# Patient Record
Sex: Male | Born: 1935 | Race: White | Hispanic: No | Marital: Married | Smoking: Former smoker
Health system: Southern US, Community
[De-identification: ages and names within clinical notes are randomized; demographics above are authoritative.]

## PROBLEM LIST (undated history)

## (undated) DIAGNOSIS — E785 Hyperlipidemia, unspecified: Secondary | ICD-10-CM

## (undated) DIAGNOSIS — G231 Progressive supranuclear ophthalmoplegia [Steele-Richardson-Olszewski]: Secondary | ICD-10-CM

## (undated) DIAGNOSIS — I219 Acute myocardial infarction, unspecified: Secondary | ICD-10-CM

## (undated) HISTORY — DX: Hyperlipidemia, unspecified: E78.5

## (undated) HISTORY — DX: Acute myocardial infarction, unspecified: I21.9

## (undated) HISTORY — DX: Progressive supranuclear ophthalmoplegia (steele-Richardson-olszewski): G23.1

---

## 1997-12-11 ENCOUNTER — Emergency Department (HOSPITAL_COMMUNITY): Admission: EM | Admit: 1997-12-11 | Discharge: 1997-12-11 | Payer: Self-pay | Admitting: Emergency Medicine

## 1998-05-28 HISTORY — PX: CARDIAC SURGERY: SHX584

## 1999-02-09 ENCOUNTER — Encounter: Payer: Self-pay | Admitting: *Deleted

## 1999-02-09 ENCOUNTER — Inpatient Hospital Stay (HOSPITAL_COMMUNITY): Admission: EM | Admit: 1999-02-09 | Discharge: 1999-02-14 | Payer: Self-pay | Admitting: Emergency Medicine

## 1999-02-11 ENCOUNTER — Encounter: Payer: Self-pay | Admitting: Emergency Medicine

## 2005-10-23 ENCOUNTER — Ambulatory Visit: Payer: Self-pay | Admitting: Internal Medicine

## 2005-10-31 ENCOUNTER — Ambulatory Visit: Payer: Self-pay | Admitting: Internal Medicine

## 2006-01-15 ENCOUNTER — Ambulatory Visit: Payer: Self-pay

## 2006-01-16 ENCOUNTER — Ambulatory Visit: Payer: Self-pay

## 2006-03-07 ENCOUNTER — Ambulatory Visit: Payer: Self-pay | Admitting: Internal Medicine

## 2006-03-07 LAB — CONVERTED CEMR LAB
Chol/HDL Ratio, serum: 5.6
Cholesterol: 243 mg/dL (ref 0–200)
Creatinine, Ser: 1.1 mg/dL (ref 0.4–1.5)
GFR calc non Af Amer: 70 mL/min
HDL: 43.2 mg/dL (ref >39.0)
LDL DIRECT: 173.2 mg/dL
PSA: 6.09 ng/mL — ABNORMAL HIGH (ref 0.10–4.00)
Total Bilirubin: 0.7 mg/dL (ref 0.3–1.2)
Triglyceride fasting, serum: 72 mg/dL (ref 0–149)
VLDL: 14 mg/dL (ref 0–40)

## 2006-03-13 ENCOUNTER — Ambulatory Visit: Payer: Self-pay | Admitting: Internal Medicine

## 2006-05-24 ENCOUNTER — Ambulatory Visit: Payer: Self-pay | Admitting: Internal Medicine

## 2006-05-24 LAB — CONVERTED CEMR LAB
Chol/HDL Ratio, serum: 5.7
Triglyceride fasting, serum: 108 mg/dL (ref 0–149)

## 2006-06-25 ENCOUNTER — Ambulatory Visit: Payer: Self-pay | Admitting: Internal Medicine

## 2006-08-23 ENCOUNTER — Encounter: Admission: RE | Admit: 2006-08-23 | Discharge: 2006-08-23 | Payer: Self-pay | Admitting: Neurology

## 2006-09-10 ENCOUNTER — Ambulatory Visit: Payer: Self-pay | Admitting: Internal Medicine

## 2006-12-13 ENCOUNTER — Encounter: Admission: RE | Admit: 2006-12-13 | Discharge: 2007-03-13 | Payer: Self-pay | Admitting: Neurology

## 2007-05-29 HISTORY — PX: ABDOMINAL AORTIC ANEURYSM REPAIR: SHX42

## 2007-07-01 ENCOUNTER — Emergency Department (HOSPITAL_COMMUNITY): Admission: EM | Admit: 2007-07-01 | Discharge: 2007-07-01 | Payer: Self-pay | Admitting: Emergency Medicine

## 2007-07-08 ENCOUNTER — Ambulatory Visit (HOSPITAL_COMMUNITY): Admission: RE | Admit: 2007-07-08 | Discharge: 2007-07-09 | Payer: Self-pay | Admitting: Orthopedic Surgery

## 2007-12-27 DIAGNOSIS — G231 Progressive supranuclear ophthalmoplegia [Steele-Richardson-Olszewski]: Secondary | ICD-10-CM

## 2007-12-27 HISTORY — DX: Progressive supranuclear ophthalmoplegia (steele-Richardson-olszewski): G23.1

## 2007-12-29 ENCOUNTER — Ambulatory Visit: Payer: Self-pay | Admitting: Vascular Surgery

## 2007-12-31 ENCOUNTER — Ambulatory Visit (HOSPITAL_COMMUNITY): Admission: RE | Admit: 2007-12-31 | Discharge: 2007-12-31 | Payer: Self-pay | Admitting: Vascular Surgery

## 2007-12-31 ENCOUNTER — Ambulatory Visit: Payer: Self-pay | Admitting: *Deleted

## 2008-01-21 ENCOUNTER — Inpatient Hospital Stay (HOSPITAL_COMMUNITY): Admission: RE | Admit: 2008-01-21 | Discharge: 2008-01-24 | Payer: Self-pay | Admitting: Vascular Surgery

## 2008-01-21 HISTORY — PX: ABDOMINAL AORTIC ANEURYSM REPAIR: SUR1152

## 2008-02-24 ENCOUNTER — Encounter: Admission: RE | Admit: 2008-02-24 | Discharge: 2008-02-24 | Payer: Self-pay | Admitting: Vascular Surgery

## 2008-02-24 ENCOUNTER — Ambulatory Visit: Payer: Self-pay | Admitting: Vascular Surgery

## 2008-08-13 ENCOUNTER — Encounter: Admission: RE | Admit: 2008-08-13 | Discharge: 2008-08-13 | Payer: Self-pay | Admitting: Internal Medicine

## 2008-09-07 ENCOUNTER — Ambulatory Visit: Payer: Self-pay | Admitting: Vascular Surgery

## 2008-09-07 ENCOUNTER — Encounter: Admission: RE | Admit: 2008-09-07 | Discharge: 2008-09-07 | Payer: Self-pay | Admitting: Vascular Surgery

## 2008-12-02 ENCOUNTER — Encounter (INDEPENDENT_AMBULATORY_CARE_PROVIDER_SITE_OTHER): Payer: Self-pay | Admitting: *Deleted

## 2009-03-08 ENCOUNTER — Encounter: Admission: RE | Admit: 2009-03-08 | Discharge: 2009-03-08 | Payer: Self-pay | Admitting: Vascular Surgery

## 2009-03-08 ENCOUNTER — Ambulatory Visit: Payer: Self-pay | Admitting: Vascular Surgery

## 2009-06-09 ENCOUNTER — Telehealth: Payer: Self-pay | Admitting: Internal Medicine

## 2009-09-06 ENCOUNTER — Encounter: Admission: RE | Admit: 2009-09-06 | Discharge: 2009-09-06 | Payer: Self-pay | Admitting: Vascular Surgery

## 2009-09-06 ENCOUNTER — Ambulatory Visit: Payer: Self-pay | Admitting: Vascular Surgery

## 2010-05-08 ENCOUNTER — Ambulatory Visit: Payer: Self-pay | Admitting: Vascular Surgery

## 2010-06-18 ENCOUNTER — Encounter: Payer: Self-pay | Admitting: Internal Medicine

## 2010-06-19 ENCOUNTER — Encounter: Payer: Self-pay | Admitting: Vascular Surgery

## 2010-06-27 NOTE — Progress Notes (Signed)
Summary: Schedule Colonoscopy  Phone Note Outgoing Call Call back at [facility] Phone [phone]   Call placed by: Harlow Mares CMA Duncan Dull),  June 09, 2009 4:11 PM Call placed to: Patient Summary of Call: Left message on patients machine to call back. patient needs to have a direct colonoscopy if he meets the guidlines.  Initial call taken by: Harlow Mares CMA Duncan Dull),  June 09, 2009 4:12 PM  Follow-up for Phone Call        Left message on patients machine to call back.  Follow-up by: Harlow Mares CMA Duncan Dull),  June 17, 2009 11:54 AM  Additional Follow-up for Phone Call Additional follow up Details #1::        Left message on patients machine to call back. multiple contacts to contact the patient without a return call  Additional Follow-up by: Harlow Mares CMA Duncan Dull),  June 23, 2009 10:41 AM     Appended Document: Schedule Colonoscopy pt says he has GI care elsewhere

## 2010-10-03 ENCOUNTER — Other Ambulatory Visit: Payer: Self-pay | Admitting: Vascular Surgery

## 2010-10-03 DIAGNOSIS — I714 Abdominal aortic aneurysm, without rupture: Secondary | ICD-10-CM

## 2010-10-10 NOTE — Assessment & Plan Note (Signed)
OFFICE VISIT   Rodney Velazquez, Rodney Velazquez  DOB:  23-Nov-1931                                       09/07/2008  UXLKG#:[id number]   The patient is status post insertion of aortic stent graft of abdominal  aortic aneurysm August 2009 using a Gore excluder graft.  He did have a  type 1 leak in the OR which required a proximal extension which resolved  the leak.  On his first CT scan in September there was a very subtle  possible type 2 leak.  Today he returns and review of the CT scan  reveals again a subtle probable type 2 leak from the lumbar branch in  the midportion of the aortic aneurysm sac.  The aneurysm sac itself has  slightly decreased in size to about 5.1 cm in maximum diameter.  He has  not had any abdominal or back symptoms.   On exam today blood pressure 158/82, heart rate 70, respirations 14.  His carotid pulse is 3+, no audible bruits.  Neurologic exam reveals  some somewhat slow speech possibly from his Parkinson's disease which is  being treated at [facility].  Abdomen is soft, nontender with no pulsatile  mass noted.  He has 3+ femoral and posterior tibial pulses bilaterally.   I think he does have a very small type 2 endoleak from the lumbar branch  we will continue to follow.  He will return in 6 month with a followup  CT angiogram at that time unless he develops any acute abdominal  symptoms in the interim.   Quita Skye Hart Rochester, M.D.  Electronically Signed   JDL/MEDQ  D:  09/07/2008  T:  09/08/2008  Job:  [id number]

## 2010-10-10 NOTE — Procedures (Signed)
DUPLEX ULTRASOUND OF ABDOMINAL AORTA   INDICATION:  Followup abdominal aortic aneurysm stent with known type 2  endo leak.   HISTORY:  Diabetes:  No.  Cardiac:  Previous MI.  Hypertension:  Yes.  Smoking:  Previous.  Connective Tissue Disorder:  Family History:  No.  Previous Surgery:  Stent graft repair of AAA on 01/21/2008.   DUPLEX EXAM:         AP (cm)                   TRANSVERSE (cm)  Proximal             2.6 cm                    2.6 cm  Mid                  2.8 cm                    2.9 cm  Distal               5.0 cm                    4.7 cm  Right Iliac          Not visualized            Not visualized  Left Iliac           Not visualized            Not visualized   PREVIOUS:  Date:  09/06/2009 (CT)  AP:  5.0  TRANSVERSE:   IMPRESSION:  1. Patent stent repair of abdominal aortic aneurysm with no evidence      of stenosis or endo leak based on limited visualization, however,      the previous CAT scans have noted a small type 2 endo leak.  2. The abdominal aortic aneurysm sac size is stable when compared to      the previous CT.  3. Unable to adequately visualize the bilateral common iliac arteries      due to overlying bowel gas patterns.   ___________________________________________  Quita Skye Hart Rochester, M.D.   CH/MEDQ  D:  05/08/2010  T:  05/08/2010  Job:  [id number]

## 2010-10-10 NOTE — Assessment & Plan Note (Signed)
OFFICE VISIT   KHUP, SAPIA  DOB:  1932-01-17                                       03/08/2009  WUJWJ#:[id number]   The patient returns for 6 month followup regarding his aortic stent  graft insertion for an infrarenal abdominal aortic aneurysm.  He was  last seen 6 months ago.  The aneurysm surgical procedure was performed  in August of 2009.  He has had a small type 2 endo leak which we have  been following from either a lumbar or accessory renal vessel which has  been stable.  He complains of no abdominal symptoms since his last visit  and a CT angiogram today revealed no change in the type 2 endo leak  which still appears to be present.  The size of the aneurysm has not  enlarged, however, remaining around 5 cm in diameter.  Both iliac limbs  are widely patent with no migration of the graft.  He has had a few  falling episodes and has discussed this with Dr. Clelia Croft who has adjusted  his medications.  He seems to lose his balance and fall backwards on  occasion.   PHYSICAL EXAM:  Blood pressure 151/82, heart rate 74, respirations 14.  His carotid pulses are 3+ with no audible bruits.  Neurologic exam is  grossly normal.  Abdomen is soft, nontender with no pulsatile mass  noted.  He has 3+ femoral and popliteal pulses bilaterally and 2+  posterior tibial pulses and well-perfused lower extremities.   In general I think he is doing well from the standpoint of his aneurysm  surgery.  Apparently he is being evaluated by a neurologist in [place]  currently.  He will return in 6 months with repeat CT angiogram to  continue to monitor this type 2 endo leak.   Quita Skye Hart Rochester, M.D.  Electronically Signed   JDL/MEDQ  D:  03/08/2009  T:  03/09/2009  Job:  [id number]

## 2010-10-10 NOTE — Consult Note (Signed)
VASCULAR SURGERY CONSULTATION   CHANCELLOR, VANDERLOOP  DOB:  06/28/1931                                       12/29/2007  JWJXB#:[id number]   This is a Vascular Surgery consultation.  The patient was referred by  Dr. Clelia Croft for evaluation of a recently diagnosed abdominal aortic  aneurysm.  This 77 year old gentleman has apparently lost about 20-25  pounds in the last 8-12 months, currently weighing about 183 pounds, he  states.  He has had a good appetite and recently his weight loss has  stopped and he is now doing better from that standpoint.  He had a chest  and abdomen CT ordered by Dr. Clelia Croft to look for an occult malignancy and  there was some findings which included mild emphysema as well as some  scattered mediastinal lymph nodes, cholelithiasis, left renal cyst as  well as some diverticulosis in the colon.  Also noted was a larger than  5 cm infrarenal abdominal aortic aneurysm and he was referred for  further evaluation.  He has no history previously knowing about this  aneurysm.   PAST MEDICAL HISTORY:  1. Hypertension.  2. Hyperlipidemia.  3. Coronary artery disease with previous myocardial infarction      apparently in 2000 when he suffered a bee sting, gave himself a      shot of epinephrine which apparently may have had something to do      with causing the myocardial infarction according to the patient.      He had PTCA and stenting done by Dr. Daphene Jaeger, he has done well      since that time.  4. Negative for known diabetes, congestive heart failure.   SURGERY:  The PTCA and stenting.   FAMILY HISTORY:  Negative coronary artery disease, diabetes and stroke.   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  He has not smoked in 30 years, but smoked 2 packs a day prior  to that.  Does not use alcohol.   REVIEW OF SYSTEMS:  Is significant in that he has been having some  balance problems and has had evaluation by a  neurologist at [facility]  with possible diagnosis of Parkinson's disease, although this is not  definite.  He has not seen this neurologist in about 1 year.  His wife  thinks that he continues to have some symptoms although they have  stabilized somewhat.  Denies any chest pain, dyspnea on exertion, PND,  orthopnea, has no known pulmonary or GI symptoms.   ALLERGIES:  None known.   MEDICATIONS:  Sinemet 3 tablets three times a day, aspirin 1 tablet  daily, blood pressure medication, unknown what this is.   PHYSICAL EXAM:  Vitals:  Blood pressure 128/80, heart rate 70,  respirations 14.  General:  He is a male patient, he is in no apparent  distress, alert and x3.  Neck:  Supple, 3+ carotid pulses palpable.  No  bruits are audible.  Neurologic:  Normal.  There is no palpable  adenopathy in the neck.  Chest:  Clear to auscultation.  Cardiovascular:  Regular rhythm with no murmurs.  Abdomen:  Soft, nontender with  pulsatile mass in the mid epigastrium measuring about 5 cm in diameter.  This is not tender.  He has 3+ femoral, popliteal,  and dorsalis pedis  pulses palpable bilaterally.  Both upper extremities are well perfused.   I reviewed his CT scan today, done at [facility], which was done with  contrast and this reveals an infrarenal bilobulated aneurysm measuring  at least 5 cm in diameter, extends down to the aortic bifurcation.  His  iliacs are quite tortuous, particularly the one on the left which  extends well over to the right of the midline.   We will need to evaluate him to see if he is a stent graft candidate,  but I am concerned about the tortuosity of the iliac arteries.  Will  obtain a Cardiolite, by Dr. Landry Dyke office, for by his previous history  of PTCA and stenting, and obtain abdominal aortogram this week and he  will return next week further discussion of this.   Quita Skye Hart Rochester, M.D.  Electronically Signed  JDL/MEDQ  D:  12/29/2007  T:  12/30/2007  Job:   [id number]   cc:   Kari Baars, M.D.

## 2010-10-10 NOTE — Op Note (Signed)
NAMEJOANGEL, VANOSDOL             ACCOUNT NO.:  [id number]   MEDICAL RECORD NO.:  [id number]          PATIENT TYPE:  AMB   LOCATION:  SDS                          FACILITY:  [facility]   PHYSICIAN:  Balinda Quails, M.D.    DATE OF BIRTH:  10-Dec-1931   DATE OF PROCEDURE:  12/31/2007  DATE OF DISCHARGE:  12/31/2007                               OPERATIVE REPORT   PHYSICIAN:  Balinda Quails, MD   DIAGNOSIS:  A 5.3-cm abdominal aortic aneurysm.   PROCEDURE:  Abdominal aortogram with pelvic runoff arteriography.   ACCESS:  Right common femoral artery 5-French sheath.   CONTRAST:  115 mL Visipaque.   COMPLICATIONS:  None apparent.   CLINICAL NOTE:  Augustus Zurawski is a 77 year old male seen in  consultation by Dr. Hart Rochester with a CT scan revealing a 5.3-cm abdominal  aortic aneurysm.  Due to significant tortuosity in the vessels, Dr.  Hart Rochester has requested an abdominal aortogram for further evaluation and  planning of possible stent placement.   PROCEDURE NOTE:  The patient was brought to the cath lab in stable  condition.  He was placed in supine position.  Both groins were prepped  and draped in sterile fashion.  Skin and subcutaneous tissues were  instilled with 1% Xylocaine.  An 18-gauge needle was introduced into the  right common femoral artery.  A 0.035 J-wire passed through the needle  into the mid abdominal aorta.  A 5-French sheath advanced over the  guidewire.  A graduated pigtail catheter was advanced over the guidewire  and extended to the juxtarenal aorta.   Abdominal aortogram was obtained.  This revealed marked tortuosity of  the infrarenal aorta and proximal common iliac arteries.  Patent  bilateral renal arteries, left renal artery lying the lowermost.  A 1.5-  2 cm infrarenal aortic neck.  Tapering of vein tortuous abdominal aortic  aneurysm at the common iliac arteries bilaterally.   Bilateral oblique pelvic arteriography obtained.  The common iliac  arteries patent  bilaterally.  Mild stenosis noted at the origin of left  common iliac artery.  Marked tortuosity to the common iliac arteries  noted.  The hypogastric and external iliac arteries were patent  bilaterally.   This completed the arteriogram procedure.  No apparent complications.  Guidewire reinserted and catheter removed.   FINAL IMPRESSION:  1. Single bilateral patent renal arteries.  2. A 1.5-2 cm infrarenal aortic neck.  3. A 5.3 cm abdominal aortic aneurysm.  4. Mild stenosis at left common iliac artery origin.  5. Tortuous iliac vessels bilaterally which were widely patent,      otherwise.   DISPOSITION:  These results will be reviewed further by Dr. Hart Rochester and  final decision made regarding disposition with regard to management of  his AAA.      Balinda Quails, M.D.  Electronically Signed     PGH/MEDQ  D:  12/31/2007  T:  01/01/2008  Job:  [id number]   cc:   Quita Skye. Hart Rochester, M.D.

## 2010-10-10 NOTE — Op Note (Signed)
Rodney Velazquez, Rodney Velazquez             ACCOUNT NO.:  [id number]   MEDICAL RECORD NO.:  [id number]          PATIENT TYPE:  INP   LOCATION:  3311                         FACILITY:  [facility]   PHYSICIAN:  Juleen China IV, MDDATE OF BIRTH:  [birth year]   DATE OF PROCEDURE:  01/21/2008  DATE OF DISCHARGE:                               OPERATIVE REPORT   PREOPERATIVE DIAGNOSIS:  Abdominal aortic aneurysm.   POSTOPERATIVE DIAGNOSIS:  Abdominal aortic aneurysm.   PROCEDURE PERFORMED:  Left common femoral artery exposure.   SURGEON:  1. Charlena Cross, MD   ASSISTANT:  Hart Rochester.   ANESTHESIA:  General.   BLOOD LOSS:  Minimal.   This is the dictation for left femoral artery exposure during  endovascular aneurysm repair.  Please see Dr. Candie Chroman dictation for  details of the aneurysm repair.  This is the dictation of exploration of  the left femoral artery and closure.   PROCEDURE:  The patient was identified in the holding area and taken to  room 9.  He was placed supine on the table.  He is prepped and draped in  the standard sterile fashion after anesthesia had been administered.  Antibiotics were given and a time-out was called.   The inguinal ligament was identified by bony landmarks.  An oblique  incision was made over the palpable pulse of the left common femoral  artery.  Cautery was used to dissect the subcutaneous tissue to identify  the femoral sheath.  The femoral sheath was opened sharply with  Metzenbaum scissors.  Common femoral artery was then mobilized  proximally and distally, and vessel loops were placed.   Following deployment of the Endograft, the sheaths and wires were  removed from the left groin.  Henley clamp has been placed proximally  and a peripheral DeBakey clamp placed distally.  Artery was flushed with  heparinized saline.  There was excellent backbleeding as well as  antegrade blood flow.  The arteriotomy was closed with a running 5-0  Prolene.  Doppler  was used to evaluate the signal in the proximal and  distal anastomosis of the arterial  closure.  There was multiphasic signal.  The wound was then irrigated.  The femoral sheath was closed with 2-0 Vicryl.  The subcutaneous tissue  was closed with a  layer of 2-0 and 3-0 Vicryl.  4-0 Vicryl was used to  close the skin.  Dressings were applied.  No complications.           ______________________________  V. Charlena Cross, MD  Electronically Signed     VWB/MEDQ  D:  01/22/2008  T:  01/23/2008  Job:  [id number]

## 2010-10-10 NOTE — Assessment & Plan Note (Signed)
OFFICE VISIT   Rodney Velazquez, Rodney Velazquez  DOB:  23-Jan-1932                                       02/24/2008  UJWJX#:[id number]   The patient underwent a stent graft repair of an infrarenal abdominal  aortic aneurysm by me and Dr. Myra Gianotti on August 26 for an infrarenal  aortic aneurysm.  We used a [employer] stent graft in both common  iliac arteries and did require a proximal extension because of a type 1  leak after the initial deployment.  This proximal aortic extender cured  the type 1 leak and he has done well since his discharge from the  hospital with no complaints.  He has had no abdominal pain and has  resumed his normal activities with the exception of driving an  automobile.   On exam today blood pressure 149/83, heart rate 64, respirations 14.  His abdomen is soft with no pulsatile mass palpable.  He has 3+ femoral  pulses and well-healed super inguinal incisions.  CT angiogram was  reviewed by me which reveals excellent position of the stent graft, no  evidence of an endoleak or any migration of the graft.  He is reassured  regarding these findings and will return in 6 months for another CT  angiogram to look closely at the proximal aortic position of the graft.   Quita Skye Hart Rochester, M.D.  Electronically Signed   JDL/MEDQ  D:  02/24/2008  T:  02/25/2008  Job:  [id number]

## 2010-10-10 NOTE — Assessment & Plan Note (Signed)
OFFICE VISIT   MAVRICK, MCQUIGG  DOB:  [birth year]                                       09/06/2009  ZOXWR#:[id number]   The patient continues to be followed for his aortic stent graft for  infrarenal abdominal aortic aneurysm which I inserted in August of 2009.  He has a small type 2 endo leak which appears to be arising from a small  accessory right renal artery and a small lumbar branch.  His aneurysm  has not enlarged in size in the past.  Today he denies any abdominal or  back symptoms.  He is being evaluated by a neurologist in [place] and  also will be referred to [facility] and is continuing to have balance  and ambulation problems as his biggest concern.   He denies any chest pain, dyspnea on exertion, PND, orthopnea,  hemoptysis or wheezing.   PHYSICAL EXAM:  Vital signs:  Today blood pressure 138/83, heart rate  59, O2 sats 98%.  General:  He is alert and oriented x3.  Neck:  Supple,  3+ carotid pulses.  No bruits are audible.  HEENT:  Exam is normal.  Cardiovascular:  Reveals 3+ carotid pulses.  No bruits.  Regular rate  and rhythm with no murmurs.  Abdomen:  Soft, nontender with no pulsatile  mass noted.  He has 3+ femoral pulses bilaterally.  Well-perfused lower  extremities.   Today I ordered a CT angiogram which I reviewed and this reveals no  change in the size of his aneurysm continuing to be 5 cm in maximum  diameter.  The small endo leak (type 2) is still present.   He was reassured regarding these findings.  There is no change in the  aneurysm size.  He will return in 6 months with a duplex scan in our  office to monitor this type 2 endo leak of his infrarenal aneurysm stent  graft repair.     Quita Skye Hart Rochester, M.D.  Electronically Signed   JDL/MEDQ  D:  09/06/2009  T:  09/07/2009  Job:  [id number]

## 2010-10-10 NOTE — Op Note (Signed)
Rodney Velazquez, Rodney Velazquez             ACCOUNT NO.:  [id number]   MEDICAL RECORD NO.:  [id number]          PATIENT TYPE:  INP   LOCATION:  3311                         FACILITY:  [facility]   PHYSICIAN:  Quita Skye. Hart Rochester, M.D.  DATE OF BIRTH:  [birth year]   DATE OF PROCEDURE:  01/21/2008  DATE OF DISCHARGE:                               OPERATIVE REPORT   PREOPERATIVE DIAGNOSIS:  Infrarenal abdominal aortic aneurysm.   POSTOPERATIVE DIAGNOSIS:  Infrarenal abdominal aortic aneurysm.   OPERATION:  1. Bilateral femoral artery exposure, right side, Dr. Hart Rochester, left      side Dr. Durene Cal.  2. Insertion of an aorto-bi-common iliac Gore-Excluder stent graft      using:      a.     A 28 mm x 14 mm x 18 cm main body via right side.      b.     A 14 mm x 14 cm contralateral leg via left side.      c.     Insertion of a proximal aortic extender using a 28 mm x 3.3       cm device with completion angiography.   SURGEON:  1. Quita Skye. Hart Rochester, MD  2. Juleen China IV, MD   ANESTHESIA:  General endotracheal.   BRIEF HISTORY:  This patient was found to have an infrarenal abdominal  aortic aneurysm greater than 5 cm in size, which extended from below the  renal arteries to the bifurcation and he was scheduled after  preoperative evaluation for aorto-bi-common iliac stent grafting.   PROCEDURE:  General endotracheal anesthesia was administered.  The  abdomen, groins were prepped with Betadine scrub solution and draped in  routine sterile manner.  Appropriate monitoring lines were inserted by  Anesthesia.  Suprainguinal incisions were made bilaterally, right side  by Dr. Hart Rochester, left side by Dr. Myra Gianotti to expose the common femoral  arteries.  When this was accomplished, the patient was given 6000 units  of heparin intravenously.  A 12 sheath was inserted via the left side  into the aortic sac under fluoroscopic guidance and on the right side a  long 8-French sheath was inserted.  Using a  pigtail catheter, angiogram  was performed to identify the location of the renal arteries.  This was  done at 15 degrees LAO and 15 degrees cranial-caudal, remainder by 18 cm  main body which was inserted via the right side.  This was deployed just  at the level of the left renal artery, but because of the angulation of  the neck, it did migrate slightly into a prominent bulging area about  1.5 cm distal to the left renal artery to the patient's left side.  Following that, we deployed the left contralateral limb after  cannulating the gate using a soft SOS catheter and a Glidewire.  The  left limb was deployed and the device utilized was a 14 mm x 14 cm  device which extended into the midportion of left common iliac artery  which was a long vessel.  The right side had been completely deployed  with the initial  deployment into the right common iliac artery.  Following this, a completion angiogram was performed which revealed a  type 1 endoleak because of the slippage of the proximal end of the graft  out of this neck.  Therefore, a aortic extender cuff was utilized (28 mm  x 3.3 cm).  All of the junctions had been dilated using a coated balloon  prior to this.  The extender was positioned again at the level of the  renal artery and it secured nicely after deployment, not migrating  distally and getting a good seal proximally in the neck.  Coated balloon  was utilized to mold this proximally to get the graft to approximate  well to the left-sided bulge below the neck.  Following completion of  this, a second completion angiogram was performed, which revealed no  evidence of an endoleak.  The completion angiogram revealed no other  evidence of any endoleak with good position of the graft and good seal  of common iliac arteries.  Following this, the guidewires were removed.  Femoral arteries were both repaired using continuous 5-0 Prolene  sutures.  Protamine given to reverse the heparin and  following adequate  hemostasis, wounds closed in layers with Vicryl in subcuticular fashion.  Sterile dressing applied.  The patient taken to recovery room in stable  condition.   ESTIMATED BLOOD LOSS:  Approximately 200 mL.   COMPLICATIONS:  There were no complications, excellent urinary output.      Quita Skye Hart Rochester, M.D.  Electronically Signed     JDL/MEDQ  D:  01/21/2008  T:  01/22/2008  Job:  [id number]

## 2010-10-10 NOTE — Op Note (Signed)
Rodney Velazquez, Rodney Velazquez             ACCOUNT NO.:  [id number]   MEDICAL RECORD NO.:  [id number]          PATIENT TYPE:  OIB   LOCATION:  5007                         FACILITY:  [facility]   PHYSICIAN:  Dionne Ano. Gramig III, M.D.DATE OF BIRTH:  1932-04-29   DATE OF PROCEDURE:  07/08/2007  DATE OF DISCHARGE:  07/09/2007                               OPERATIVE REPORT   PREOPERATIVE DIAGNOSIS:  Comminuted complex interarticular right distal  radius fracture.   POSTOPERATIVE DIAGNOSIS:  Comminuted complex interarticular right distal  radius fracture.   PROCEDURE:  1. Open reduction internal fixation greater than five part intra-      articular distal radius fracture with DVR plate and synthetic bone      graft construct.  2. Stress radiography.   SURGEON:  Dominica Severin, M.D.   ASSISTANT:  Karie Chimera, PA   COMPLICATIONS:  None.   ANESTHESIA:  General.   TOURNIQUET TIME:  Less than hour.   DRAINS:  One.   INDICATIONS FOR PROCEDURE:  Rodney Velazquez is a pleasant male who  underwent provisional closed reduction a week ago.  He has had some  progressive migration and angulatory collapse which is expected, given  his age and fracture pattern; thus, he was consented for ORIF and  stabilization.  He understands the risks and benefits surgery and  desires to proceed.   OPERATIVE PROCEDURE:  The patient was seen by myself and anesthesia,  taken to the operative suite.  He was consented, permit signed, time-out  was called and arm was marked. He was given preoperative antibiotics and  underwent thorough prep and drape, Betadine scrub and paint.  Following  this the patient then had a volar radial incision made.  Dissection was  carried down.  Fasciotomy was performed to release compartments and  following this the patient then underwent a very careful and cautious  approach to the wrist.  The pronator was elevated in a radial to ulnar  direction.  Fracture was accessed and following  this, we then performed  recreation of the anatomy.  The patient had the anatomy recreated with a  combination orthopedic instrument.  He had a volar ulna and dorsal ulnar  piece that was significantly collapsed and this very carefully teased  out to the proper anatomic position.  The patient tolerated this well.  Following this, I then performed placement of bone graft.  This was  synthetic OsteoSet bone graft.  The patient tolerated this well and  there were no complicating features.  Following this a DVR plate was  applied with standard AO technique.  I was able to recreate the joint  line nicely.  We had a slight bit of radial inclination loss but the  volar tilt and the height overall looked excellent and the joint line  was recreated nicely.  Plate and screw construct was satisfactory and  stable and there were no complicating features.  Following this, I then  performed copious irrigation of the wound, followed by closure of the  pronator, placement of a drain and closure the subcu with Vicryl,  followed by skin edge with Prolene.  The patient tolerated this well and  there were no complicating features.  He had soft compartments,  excellent refill, no complicating features.  Volar plaster splint was  applied without difficulty.  He was taken to the recovery room.  He will  be given additional antibiotics, pain management according to protocol  and be monitored carefully.  We look forward to seeing him back in the  office in 10-14 days and proceed according to DVR plate protocol.  We  will cast him for four to six weeks.  At four to six weeks we will plan  for removable splint, no strengthening until he is nine to 10 weeks out.  I have discussed with him the do's and don't's, etc. and all questions  have been encouraged and answered.           ______________________________  Dionne Ano. Everlene Other, M.D.     Nash Mantis  D:  07/08/2007  T:  07/10/2007  Job:  [id number]

## 2010-10-10 NOTE — Discharge Summary (Signed)
NAMEAUSAR, GEORGIOU             ACCOUNT NO.:  [id number]   MEDICAL RECORD NO.:  [id number]          PATIENT TYPE:  INP   LOCATION:  2030                         FACILITY:  [facility]   PHYSICIAN:  Quita Skye. Hart Rochester, M.D.  DATE OF BIRTH:  August 08, 1931   DATE OF ADMISSION:  01/21/2008  DATE OF DISCHARGE:  01/24/2008                               DISCHARGE SUMMARY   DISCHARGE DIAGNOSES:  1. Abdominal aortic aneurysm.  2. Hypertension.  3. Hyperlipidemia.  4. Coronary artery disease.   PROCEDURE PERFORMED:  Endovascular repair of abdominal aortic aneurysm  by Dr. Hart Rochester on January 21, 2008, utilizing a 28 x 14 x 8 main body and  a 14 x 14 contralateral leg graft with a proximal aortic stent of 28 x  3.3 cm.  Also completion angiogram by Dr. Hart Rochester on January 21, 2008.   COMPLICATIONS:  None.   DISCHARGE MEDICATIONS:  He is instructed to resume his preoperative  medications consisting of ramipril 5 mg p.o. q.a.m., Sinemet 25/100  three p.o. t.i.d., and aspirin 81 mg p.o. daily.  He is given a  prescription for Percocet 5/325 one p.o. q.4 h. p.r.n. pain, total #20  were given.   CONDITION ON DISCHARGE:  Stable, improving.   DISPOSITION:  He is discharged home to the care of his family.  He is  given careful instructions regarding the care of his wounds.  He is to  observe the wounds and report any signs of infection.  He is given  instructions about driving and cautioned about lifting.  He may shower.  He is to return to see Dr. Hart Rochester in 4 weeks with CT scan.  The office  will make the arrangements.   BRIEF IDENTIFYING STATEMENT:  For complete details, please refer the  typed history and physical.  Briefly, this is a very pleasant 77-year-  old gentleman was referred to Dr. Hart Rochester for an abdominal aortic  aneurysm.  Dr. Hart Rochester evaluated him and felt that he should undergo  repair of his aneurysm.  He felt that he would be a good stent  candidate.  He was informed of the risks and  benefits of the procedure,  and after careful consideration, he elected to proceed with surgery.   HOSPITAL COURSE:  Preoperative workup was completed as an outpatient.  He was brought in through same-day surgery and underwent the  aforementioned endovascular repair of his abdominal aortic aneurysm on  January 21, 2008.  For complete details, please refer the typed operative  report.  The procedure was without complications.  He was returned to  the postanesthesia care unit and extubated.  Following stabilization, he  was transferred to a bed in a surgical stepdown unit.  The following  day, he was still weak.  We elected to keep him for another day.  He  remained in the stepdown unit the following morning, he was somewhat  confused.  We did transfer  him down to a bed in a surgical convalescent floor.  The following  morning, he was feeling better, his incisions were healing well.  He was  voiding.  His diet  had been advanced.  He was desirous of discharge and  was subsequently discharged home.      Wilmon Arms, PA      Quita Skye Hart Rochester, M.D.  Electronically Signed    KEL/MEDQ  D:  01/24/2008  T:  01/25/2008  Job:  [id number]

## 2010-10-31 ENCOUNTER — Ambulatory Visit (INDEPENDENT_AMBULATORY_CARE_PROVIDER_SITE_OTHER): Payer: Medicare Other | Admitting: Vascular Surgery

## 2010-10-31 ENCOUNTER — Ambulatory Visit
Admission: RE | Admit: 2010-10-31 | Discharge: 2010-10-31 | Disposition: A | Discharge status: Home / Self Care | Payer: Medicare Other | Source: Ambulatory Visit | Attending: Vascular Surgery | Admitting: Vascular Surgery

## 2010-10-31 DIAGNOSIS — I714 Abdominal aortic aneurysm, without rupture, unspecified: Secondary | ICD-10-CM

## 2010-10-31 MED ORDER — IOHEXOL 350 MG/ML SOLN
100.0000 mL | Freq: Once | INTRAVENOUS | Status: AC | PRN
  Administered 2010-10-31: 100 mL via INTRAVENOUS

## 2010-11-01 NOTE — Assessment & Plan Note (Signed)
OFFICE VISIT  Rodney, Velazquez DOB:  [birth year]                                       10/31/2010 ZOXWR#:[id number]  The patient returns today for continued followup regarding his aortic stent graft which was placed in August 2009 for abdominal aortic aneurysm.  He had a type 1 endoleak in the OR which was repaired with a proximal extension and has never recurred.  He has had a small type 2 endoleak which we have been following arising from a small accessory right renal artery and a small lumbar branch.  The aneurysm has not enlarged, however.  He has had no abdominal nor back symptoms since I last saw him a year ago.  He is being evaluated by a neurologist in [place] at the present time for his balance and ambulation problems.  CHRONIC MEDICAL PROBLEMS: 1. Hypertension. 2. Hyperlipidemia. 3. Coronary artery disease.  SOCIAL HISTORY:  He is married and has 1 child.  He is retired.  Has not smoked since 1981.  Does not use alcohol.  REVIEW OF SYSTEMS:  Positive for decreased ambulation and balance, generalized weakness and slurred speech.  PHYSICAL EXAMINATION:  Vital Signs:  Blood pressure 156/83, heart rate 72, respirations 24.  General:  He is a chronically ill-appearing male who is in a wheelchair.  Speech is slow but is audible.  HEENT:  Exam is normal for age.  EOMs intact.  Lungs:  Clear to auscultation.  No rhonchi or wheezing.  Cardiovascular:  Exam reveals a regular rhythm with no murmurs.  Abdomen:  Soft, nontender with no pulsatile mass. Extremities:  He has 3+ femoral and dorsalis pedis pulses palpable bilaterally.  Today I have ordered a CT angiogram at [facility] which I reviewed by computer.  Aneurysm maximum diameter is around 48 mm, which continues to decrease.  There is a small type 2 endoleak, possibly from a lumbar and accessory right renal.  He generally is doing well from the standpoint of the aneurysm repair.  We will  see him in 1 year with a duplex scan in our office to monitor the size of the aneurysm and alternate every other year with CT angiogram.    Quita Skye. Hart Rochester, M.D. Electronically Signed  JDL/MEDQ  D:  10/31/2010  T:  11/01/2010  Job:  [id number]  cc:   Kari Baars, M.D.

## 2010-11-20 ENCOUNTER — Ambulatory Visit (INDEPENDENT_AMBULATORY_CARE_PROVIDER_SITE_OTHER): Payer: Self-pay | Admitting: General Surgery

## 2010-12-05 ENCOUNTER — Encounter (INDEPENDENT_AMBULATORY_CARE_PROVIDER_SITE_OTHER): Payer: Self-pay | Admitting: Surgery

## 2010-12-05 ENCOUNTER — Ambulatory Visit (INDEPENDENT_AMBULATORY_CARE_PROVIDER_SITE_OTHER): Payer: Medicare Other | Admitting: General Surgery

## 2010-12-05 VITALS — BP 114/64 | HR 72 | Temp 96.8°F | Wt 180.0 lb

## 2010-12-05 DIAGNOSIS — Z973 Presence of spectacles and contact lenses: Secondary | ICD-10-CM | POA: Insufficient documentation

## 2010-12-05 DIAGNOSIS — G20 Parkinson's disease: Secondary | ICD-10-CM | POA: Insufficient documentation

## 2010-12-05 DIAGNOSIS — K8020 Calculus of gallbladder without cholecystitis without obstruction: Secondary | ICD-10-CM

## 2010-12-05 DIAGNOSIS — E7800 Pure hypercholesterolemia, unspecified: Secondary | ICD-10-CM | POA: Insufficient documentation

## 2010-12-05 DIAGNOSIS — J349 Unspecified disorder of nose and nasal sinuses: Secondary | ICD-10-CM | POA: Insufficient documentation

## 2010-12-05 DIAGNOSIS — Z972 Presence of dental prosthetic device (complete) (partial): Secondary | ICD-10-CM | POA: Insufficient documentation

## 2010-12-05 DIAGNOSIS — I219 Acute myocardial infarction, unspecified: Secondary | ICD-10-CM | POA: Insufficient documentation

## 2010-12-05 DIAGNOSIS — R238 Other skin changes: Secondary | ICD-10-CM | POA: Insufficient documentation

## 2010-12-05 NOTE — Patient Instructions (Signed)
Return to clinic if abdominal pain, weight loss, or jaundice.

## 2010-12-05 NOTE — Progress Notes (Signed)
Subjective:     Patient ID: Rodney Velazquez, male   DOB: Jan 31, 1932, 77 y.o.   MRN: [id number]    BP 114/64  Pulse 72  Temp(Src) 96.8 F (36 C) (Temporal)  Wt 180 lb (81.647 kg)    HPI The patient was referred for evaluation of gallbladder mass and gallstones which were found on recent CT scan of the abdomen to evaluate his prior aortic abdominal stent. He had a CT angiogram performed in June of 2012 which demonstrated polypoid enhancing lesions within the gallbladder the largest measuring 2.7 x 2.2 cm without any gallbladder wall thickening or ductal dilatation. Upon review of his imaging he also had similar gallstones or lesions in September of 2009 which appeared minimally changed. He is currently asymptomatic.  Upon questioning he denies any abdominal pain no pain with eating, no nausea, no fevers, no jaundice no weight loss or anorexia. He has no family history of gallbladder malignancies. Of note, he is in a wheelchair currently suffers from progressive supranuclear palsy and Parkinson's. He also has a history of coronary artery disease with 2 coronary stents placed in the 2000. Allergies no known allergies  Past medical history significant for progressive supranuclear palsy, Parkinson's disease, coronary artery disease, abdominal aortic aneurysm, cataracts  Past surgical history significant for bilateral cataract surgery 2 months ago, cardiac stent placement x2 in 2000 In abdominal aortic aneurysm stenting in 2009  Current outpatient prescriptions:aspirin 81 MG tablet, Take 81 mg by mouth daily.  , Disp: , Rfl: ;  carbidopa-levodopa (SINEMET) 10-100 MG per tablet, Take 2.5 tablets by mouth 3 (three) times daily.  , Disp: , Rfl: ;  ramipril (ALTACE) 5 MG capsule, Take 5 mg by mouth 2 (two) times daily.  , Disp: , Rfl:   ROS   Review of systems is positive for dentures , history of heart attack, high cholesterol, rash and bruising easily, and neurologic symptoms to his Parkinson's  disease otherwise review of systems negative  History  Substance Use Topics  . Smoking status: Never Smoker   . Smokeless tobacco: Not on file  . Alcohol Use: No     Review of Systems Review of systems is positive for dentures , history of heart attack, high cholesterol, rash and bruising easily, and neurologic symptoms to his Parkinson's disease otherwise review of systems negative    Objective:   Physical Exam  Constitutional: He is oriented to person, place, and time. He appears well-developed and well-nourished.  HENT:  Head: Normocephalic and atraumatic.  Eyes: Conjunctivae and EOM are normal. Right eye exhibits no discharge. Left eye exhibits no discharge. No scleral icterus.  Neck: Normal range of motion. Neck supple. No tracheal deviation present.  Cardiovascular: Normal rate, regular rhythm and normal heart sounds.   Pulmonary/Chest: Effort normal and breath sounds normal. No stridor. No respiratory distress. He has no wheezes. He exhibits no tenderness.  Abdominal: Soft. Bowel sounds are normal. He exhibits no distension and no mass. There is no tenderness. There is no rebound and no guarding.  Musculoskeletal:       Sitting in wheelchair   Neurological: He is alert and oriented to person, place, and time.  Skin: Skin is warm and dry. No rash noted. He is not diaphoretic. No erythema.  Psychiatric: He has a normal mood and affect. His behavior is normal. Judgment normal.       Assessment:     Cholelithiasis, currently asymptomatic    Plan:     He has large gallstones  present on CT angiogram of his abdomen which appeared to be present in 2009 as well. He is currently asymptomatic. No I cannot completely rule out the presence of a gallbladder mass or large polyps, including gallbladder cancer these are most likely asymptomatic cholelithiasis. I discussed this with his wife and daughter as well as the patient and they are all in agreement that surgery would be a last  resort in his case given his overall functional status. They all expressed understanding the piece may represent gallbladder malignancy which overall has a bad prognosis. Again, they are not interested in surgery at this time. I explained that if these are bilateral polyps or masses then surgery would be the recommendation. I think that since these were visible in 2009 as well on CT examination that it is unlikely that this is gallbladder malignancy and I think it is reasonable given his overall functional status to defer surgery at this point. If he becomes symptomatic from these gallstones then I would consider cholecystectomy at that time. We reviewed the signs and symptoms of gallbladder disease and they agree to followup if any of these signs or symptoms occur. He is going to obtain annual CT scans to evaluate his aortic aneurysm stent and this will also be a way to followup is gallbladder masses or gallstones. We discussed ultrasound evaluation to evaluate if these are mobile and if they are truly gallstones or masses, but the family did not feel that this was necessary since they were not adjusted in surgery at this time regardless of the results of the ultrasound.

## 2011-02-16 LAB — CBC
MCHC: 33.6
MCV: 88.8
Platelets: 241
RBC: 4.68
WBC: 7.5

## 2011-02-16 LAB — BASIC METABOLIC PANEL
BUN: 9
Calcium: 9
Chloride: 108
Creatinine, Ser: 0.86
GFR calc Af Amer: >60

## 2011-02-23 LAB — POCT I-STAT, CHEM 8
BUN: 12
Creatinine, Ser: 1
Hemoglobin: 15.6
Potassium: 4.3
Sodium: 137
TCO2: 29

## 2011-11-05 ENCOUNTER — Encounter: Payer: Self-pay | Admitting: Neurosurgery

## 2011-11-06 ENCOUNTER — Ambulatory Visit: Payer: Medicare Other | Admitting: Neurosurgery

## 2011-11-06 ENCOUNTER — Ambulatory Visit: Payer: Medicare Other | Admitting: Vascular Surgery

## 2012-06-02 ENCOUNTER — Other Ambulatory Visit: Payer: Self-pay | Admitting: *Deleted

## 2012-06-02 DIAGNOSIS — Z48811 Encounter for surgical aftercare following surgery on the nervous system: Secondary | ICD-10-CM

## 2012-06-02 DIAGNOSIS — I714 Abdominal aortic aneurysm, without rupture: Secondary | ICD-10-CM

## 2012-06-09 ENCOUNTER — Encounter: Payer: Self-pay | Admitting: Neurosurgery

## 2012-06-10 ENCOUNTER — Ambulatory Visit: Payer: Medicare Other | Admitting: Neurosurgery

## 2012-06-30 ENCOUNTER — Encounter: Payer: Self-pay | Admitting: Neurosurgery

## 2012-07-01 ENCOUNTER — Ambulatory Visit (INDEPENDENT_AMBULATORY_CARE_PROVIDER_SITE_OTHER): Payer: Medicare Other | Admitting: Neurosurgery

## 2012-07-01 ENCOUNTER — Encounter (INDEPENDENT_AMBULATORY_CARE_PROVIDER_SITE_OTHER): Payer: Medicare Other | Admitting: *Deleted

## 2012-07-01 ENCOUNTER — Encounter: Payer: Self-pay | Admitting: Neurosurgery

## 2012-07-01 VITALS — BP 155/79 | HR 56 | Resp 16 | Ht 73.0 in | Wt 159.0 lb

## 2012-07-01 DIAGNOSIS — I714 Abdominal aortic aneurysm, without rupture, unspecified: Secondary | ICD-10-CM | POA: Insufficient documentation

## 2012-07-01 DIAGNOSIS — Z48812 Encounter for surgical aftercare following surgery on the circulatory system: Secondary | ICD-10-CM

## 2012-07-01 DIAGNOSIS — Z48811 Encounter for surgical aftercare following surgery on the nervous system: Secondary | ICD-10-CM

## 2012-07-01 NOTE — Progress Notes (Signed)
[facility] AAA/Carotid Office Note  CC: AAA surveillance Referring Physician: Hart Rochester  History of Present Illness: 79 year old male patient of Dr. Hart Rochester who status post stent graft repair of a AAA in August 2009. The patient hasPSP, according to the patient's wife this is a progressive neurologic disorder much like Parkinson's that is untreatable. The patient's wife states they have seen multiple neurologists and are currently treating with a neurologist in [place] who specializes in this disorder but does not offer any treatment.  Past Medical History  Diagnosis Date  . Parkinson's disease   . Hyperlipidemia   . Heart attack   . PSP (progressive supranuclear palsy) Aug. 2009    ROS: [x]  Positive   [ ]  Denies    General: [ ]  Weight loss, [ ]  Fever, [ ]  chills Neurologic: [ ]  Dizziness, [ ]  Blackouts, [ ]  Seizure [ ]  Stroke, [ ]  "Mini stroke", [ ]  Slurred speech, [ ]  Temporary blindness; [ ]  weakness in arms or legs, [ ]  Hoarseness Cardiac: [ ]  Chest pain/pressure, [ ]  Shortness of breath at rest [ ]  Shortness of breath with exertion, [ ]  Atrial fibrillation or irregular heartbeat Vascular: [ ]  Pain in legs with walking, [ ]  Pain in legs at rest, [ ]  Pain in legs at night,  [ ]  Non-healing ulcer, [ ]  Blood clot in vein/DVT,   Pulmonary: [ ]  Home oxygen, [ ]  Productive cough, [ ]  Coughing up blood, [ ]  Asthma,  [ ]  Wheezing Musculoskeletal:  [ ]  Arthritis, [ ]  Low back pain, [ ]  Joint pain Hematologic: [ ]  Easy Bruising, [ ]  Anemia; [ ]  Hepatitis Gastrointestinal: [ ]  Blood in stool, [ ]  Gastroesophageal Reflux/heartburn, [ ]  Trouble swallowing Urinary: [ ]  chronic Kidney disease, [ ]  on HD - [ ]  MWF or [ ]  TTHS, [ ]  Burning with urination, [ ]  Difficulty urinating Skin: [ ]  Rashes, [ ]  Wounds Psychological: [ ]  Anxiety, [ ]  Depression   Social History History  Substance Use Topics  . Smoking status: Former Smoker    Quit date: 05/28/1976  .  Smokeless tobacco: Never Used  . Alcohol Use: No    Family History Family History  Problem Relation Age of Onset  . Cancer Mother   . Cancer Father     No Known Allergies  Current Outpatient Prescriptions  Medication Sig Dispense Refill  . alendronate (FOSAMAX) 70 MG tablet Take 70 mg by mouth every 7 (seven) days. Take with a full glass of water on an empty stomach.      Marland Kitchen aspirin 81 MG tablet Take 81 mg by mouth daily.        . carbidopa-levodopa (SINEMET) 10-100 MG per tablet Take 2.5 tablets by mouth 3 (three) times daily.        . ergocalciferol (VITAMIN D2) 50000 UNITS capsule Take 50,000 Units by mouth 2 (two) times a week.      . ramipril (ALTACE) 5 MG capsule Take 5 mg by mouth 2 (two) times daily.          Physical Examination  Filed Vitals:   07/01/12 0926  BP: 155/79  Pulse: 56  Resp: 16    Body mass index is 20.98 kg/(m^2).  General:  WDWN in NAD Gait: Normal HEENT: WNL Eyes: Pupils equal Pulmonary: normal non-labored breathing , without Rales, rhonchi,  wheezing Cardiac: RRR, without  Murmurs, rubs or gallops; Abdomen: soft, NT, no masses Skin: no rashes, ulcers noted  Vascular Exam  Pulses: Palpable radial pulses bilaterally, due to the patient's condition femoral pulses are not palpated Carotid bruits: Carotid pulses to auscultation no bruits are heard Extremities without ischemic changes, no Gangrene , no cellulitis; no open wounds;  Musculoskeletal: no muscle wasting or atrophy   Neurologic: A&O X 3; Appropriate Affect ; SENSATION: normal; MOTOR FUNCTION:  moving all extremities equally. Speech is fluent/normal  Non-Invasive Vascular Imaging AAA duplex today shows a maximum diameter of 4.49 AP which is a decrease from previous exam in December 2011 when he was 5.0, there is no endoleak detected    ASSESSMENT/PLAN: Asymptomatic patient status post AAA stent repair, the patient will followup in one year with repeat aortoiliac duplex. The patient's  wife's questions were encouraged and answered, she is in agreement with this plan.  Lauree Chandler ANP   Clinic MD: Hart Rochester

## 2012-07-02 ENCOUNTER — Other Ambulatory Visit: Payer: Self-pay | Admitting: *Deleted

## 2012-07-02 DIAGNOSIS — I714 Abdominal aortic aneurysm, without rupture: Secondary | ICD-10-CM

## 2012-07-02 DIAGNOSIS — Z48812 Encounter for surgical aftercare following surgery on the circulatory system: Secondary | ICD-10-CM

## 2012-07-04 ENCOUNTER — Other Ambulatory Visit (HOSPITAL_COMMUNITY): Payer: Self-pay | Admitting: Internal Medicine

## 2012-07-04 DIAGNOSIS — R1310 Dysphagia, unspecified: Secondary | ICD-10-CM

## 2012-07-10 ENCOUNTER — Ambulatory Visit (HOSPITAL_COMMUNITY): Admission: RE | Admit: 2012-07-10 | Payer: Medicare Other | Source: Ambulatory Visit

## 2012-07-10 ENCOUNTER — Other Ambulatory Visit (HOSPITAL_COMMUNITY): Payer: Medicare Other

## 2012-07-15 ENCOUNTER — Ambulatory Visit (HOSPITAL_COMMUNITY)
Admission: RE | Admit: 2012-07-15 | Discharge: 2012-07-15 | Disposition: A | Discharge status: Home / Self Care | Payer: Medicare Other | Source: Ambulatory Visit | Attending: Internal Medicine | Admitting: Internal Medicine

## 2012-07-15 DIAGNOSIS — R1310 Dysphagia, unspecified: Secondary | ICD-10-CM

## 2012-07-15 NOTE — Procedures (Signed)
Objective Swallowing Evaluation: Modified Barium Swallowing Study  Patient Details  Name: Rodney Velazquez MRN: [id number] Date of Birth: May 27, 1932  Today's Date: 07/15/2012 Time: 4098-1191 SLP Time Calculation (min): 75 min  Past Medical History:  Past Medical History  Diagnosis Date  . Parkinson's disease   . Hyperlipidemia   . Heart attack   . PSP (progressive supranuclear palsy) Aug. 2009   Past Surgical History:  Past Surgical History  Procedure Laterality Date  . Cardiac surgery  2000  . Abdominal aortic aneurysm repair  2009  . Abdominal aortic aneurysm repair  Aug. 26, 2009   HPI:  79 yo male referred by Dr Clelia Croft for MBS.  Pt has PSP- diagnosed in August 2009, Parkinsonism, abdominal aortic aneurysm, MI, sinus problems, former smoker - quit 1978.  Pt resides with his wife at home.  Per wife and pt, pt will occasionaly cough with intake with food more than drink.  Weight loss from 190 to 159 over five years reported by spouse.  Pt is wheelchair bound and had difficulty holding his head up during today's test.  Spouse denies pt ever requiring heimlich manuever. Pt is largely unable to feed himself except finger foods and drinks per his spouse.  His wife states it takes approx 30 minutes for pt to eat a meal.         Assessment / Plan / Recommendation Clinical Impression  Dysphagia Diagnosis: Severe oral phase dysphagia;Moderate pharyngeal phase dysphagia   Clinical impression:  Pt presents with severe oral and moderate pharyngeal phase dysphagia.  Oral deficits due to gross weakness resulted in lingual pumping, delayed transit and premature spillage of barium into pharynx.  Pharyngeal swallow initially was strong and timely (once barium transited into pharynx).   But as testing progressed pharyngeal stasis worsened without pt awareness nor ability to dry swallow to clear.  Head turn left trial (? left facial droop and spouse reports pt's left side is weaker) was not effective  to decrease amount of stasis.  Pudding swallow decreased amount of liquid pharyngeal stasis, but did not eliminate it.  A single trace aspiration SILENT noted with thin at end of study.  Cued cough was weak and ineffective to clear aspirates.    Pt does better when given small single sips in a cup at a time due to oral control deficits and delayed responses with PSP.   Suspect pt may experience aspiration of his secretions and good oral care is paramount.   Aspiration risk will be chronic but advised spouse and pt extensively to ways to mitigate dysphagia/aspiration.  Suspect when pt is coughing with intake at home, it is due to penetration or aspiration.  Unfortunately volitional cough is weak and ineffective.  Modifications to diet and amount of intake will be indicated with fatigue factor and disease progression.    Rec follow up [facility] for dysphagia management.  Thanks for referral.     Treatment Recommendation    defer to primary slp   Diet Recommendation Dysphagia 3 (Mechanical Soft);Thin liquid;Nectar-thick liquid (nectar if pt overtly coughing with thin, ground meats)   Liquid Administration via: Cup (consider provale cup for bolus control) Medication Administration: Whole meds with puree Supervision: Trained caregiver to feed patient;Full supervision/cueing for compensatory strategies (heimlich manuever instruction for spouse) Compensations: Slow rate;Small sips/bites (follow liquids with applesauce to aid pharyngeal clearance) Postural Changes and/or Swallow Maneuvers: Seated upright 90 degrees;Upright 30-60 min after meal (several small meals!, intermittent dry swallow, rest breaks)  Other  Recommendations Oral Care Recommendations: Oral care QID (educated spouse that feed tubes do not prevent aspiration) Other Recommendations:  (discuss UV:[id number] dysphagia progress to severe asp/FTT risk)   Follow Up Recommendations  Home health SLP    Frequency and Duration         Pertinent Vitals/Pain Not pneumonias per spouse    SLP Swallow Goals  n/a eval, educate and dc   General HPI: 79 yo male referred by Dr Clelia Croft for MBS.  Pt has PSP- diagnosed in August 2009, Parkinsonism, abdominal aortic aneurysm, MI, sinus problems, former smoker - quit 1978.  Pt resides with his wife at home.  Per wife and pt, pt will occasionaly cough with intake with food more than drink.  Weight loss from 190 to 159 over five years reported by spouse.  Pt is wheelchair bound and had difficulty holding his head up during today's test.  Spouse denies pt ever requiring heimlich manuever. Pt is largely unable to feed himself except finger foods and drinks per his spouse.  His wife states it takes approx 30 minutes for pt to eat a meal.     Type of Study: Modified Barium Swallowing Study Reason for Referral: Objectively evaluate swallowing function Diet Prior to this Study: Dysphagia 3 (soft);Thin liquids Temperature Spikes Noted: No Respiratory Status: Room air History of Recent Intubation: No Behavior/Cognition: Alert;Cooperative (delayed responses) Oral Cavity - Dentition: Adequate natural dentition;Dentures, top Oral Motor / Sensory Function: Impaired motor;Impaired sensory Oral impairment: Left facial;Right lingual (overt weakness, oral secretions retained in mouth, lingual ) Self-Feeding Abilities: Needs assist;Total assist Patient Positioning: Upright in chair (pt leans to the left, wife reports left side is weaker) Baseline Vocal Quality: Breathy;Hoarse;Low vocal intensity Volitional Cough: Weak Volitional Swallow: Unable to elicit Anatomy: Within functional limits    Reason for Referral Objectively evaluate swallowing function   Oral Phase Oral Preparation/Oral Phase Oral Phase: Impaired Oral - Nectar Oral - Nectar Cup: Lingual pumping;Weak lingual manipulation;Reduced posterior propulsion;Holding of bolus;Delayed oral transit Oral - Thin Oral - Thin Teaspoon: Reduced  posterior propulsion;Weak lingual manipulation;Lingual pumping;Delayed oral transit;Holding of bolus Oral - Thin Cup: Lingual pumping;Weak lingual manipulation;Reduced posterior propulsion;Delayed oral transit;Holding of bolus Oral - Thin Straw: Reduced posterior propulsion;Lingual pumping;Holding of bolus;Delayed oral transit;Weak lingual manipulation Oral - Solids Oral - Puree: Lingual pumping;Weak lingual manipulation;Reduced posterior propulsion Oral - Mechanical Soft: Impaired mastication;Reduced posterior propulsion;Lingual pumping;Weak lingual manipulation Oral - Regular: Not tested Oral - Pill: Not tested Oral Phase - Comment Oral Phase - Comment: pt with secretions retained in oral cavity without awareness, pt drools (primarily from right side of mouth per pt)   Pharyngeal Phase Pharyngeal Phase Pharyngeal Phase: Impaired Pharyngeal - Nectar Pharyngeal - Nectar Cup: Pharyngeal residue - valleculae;Reduced tongue base retraction;Reduced pharyngeal peristalsis Pharyngeal - Thin Pharyngeal - Thin Teaspoon: Pharyngeal residue - valleculae;Pharyngeal residue - pyriform sinuses;Reduced tongue base retraction;Reduced pharyngeal peristalsis Pharyngeal - Thin Cup: Pharyngeal residue - pyriform sinuses;Pharyngeal residue - valleculae;Penetration/Aspiration during swallow;Trace aspiration;Reduced tongue base retraction;Reduced pharyngeal peristalsis (single episode of aspiration at end of study,?fatigue factor) Penetration/Aspiration details (thin cup): Material enters airway, passes BELOW cords without attempt by patient to eject out (silent aspiration) Pharyngeal - Thin Straw: Pharyngeal residue - pyriform sinuses;Pharyngeal residue - valleculae;Reduced tongue base retraction;Reduced pharyngeal peristalsis Pharyngeal - Solids Pharyngeal - Puree: Premature spillage to valleculae;Pharyngeal residue - pyriform sinuses;Reduced pharyngeal peristalsis;Reduced tongue base retraction (swallow of pudding  was stronger than with liquids) Pharyngeal - Mechanical Soft: Premature spillage to valleculae;Reduced tongue base retraction;Pharyngeal residue -  valleculae Pharyngeal Phase - Comment Pharyngeal Comment: unfortuantely pt was unable to conduct dry swallows to aid pharyngeal clearance, toward end of study liquid stasis noted without pt sensation, applesauce swallows facilitated clearance of liquids, pharyngeal residuals worsened as study progressed, ? fatigue.   Cervical Esophageal Phase    GO    Cervical Esophageal Phase Cervical Esophageal Phase: WFL (esophagus appeared to clear adequately during esoph sweeps)    Functional Assessment Tool Used: MBS clinical judgement Functional Limitations: Swallowing Swallow Current Status (W1191): At least 60 percent but less than 80 percent impaired, limited or restricted Swallow Goal Status [phone]): At least 60 percent but less than 80 percent impaired, limited or restricted Swallow Discharge Status [phone]): At least 60 percent but less than 80 percent impaired, limited or restricted    Donavan Burnet, MS [facility] SLP [phone]

## 2013-03-28 DEATH — deceased

## 2013-05-26 IMAGING — CT CT CTA ABD/PEL W/CM AND/OR W/O CM
2 of 10 series · 11 of 46 positions shown, 17 images · IV contrast ([ID] [id number])
Comparison: Multiple priors, most recent 09/06/2009.

CLINICAL DATA: Abdominal aortic aneurysm status post repair.

CT ANGIOGRAPHY ABDOMEN AND PELVIS WITH CONTRAST AND WITHOUT
CONTRAST
TECHNIQUE: Noncontrast images of the abdomen pelvis, arterial
phase images through the abdomen and pelvis, and delayed images
through the abdomen following the uneventful administration of 100
ml Omnipaque 350 intravenous contrast.

[Series 5: angio · axial · 0.72mm/px · z∈[-405,-30]mm · 10 of 244 slices shown, 16 images]
[im 23/244  soft-tissue]
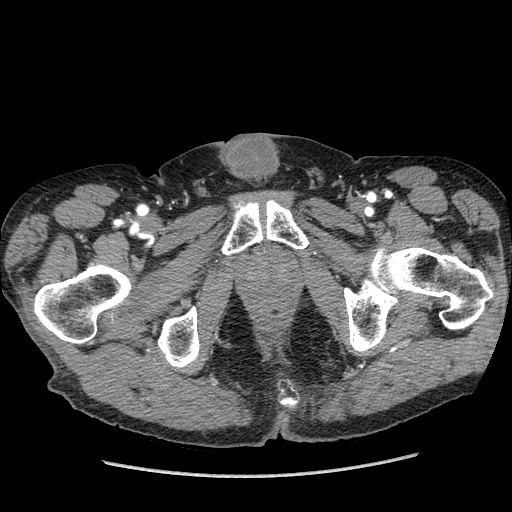
[im 23/244  bone]
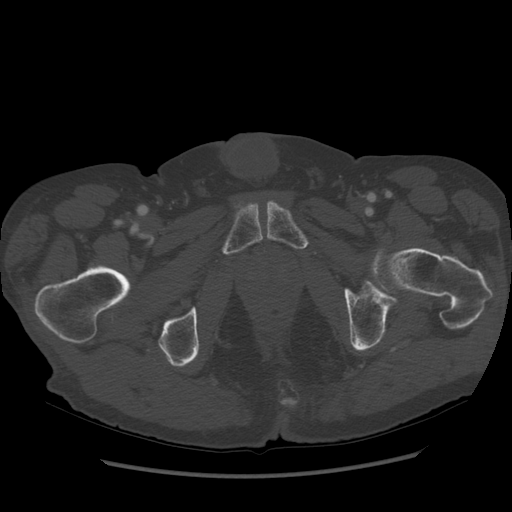
[im 45/244  soft-tissue]
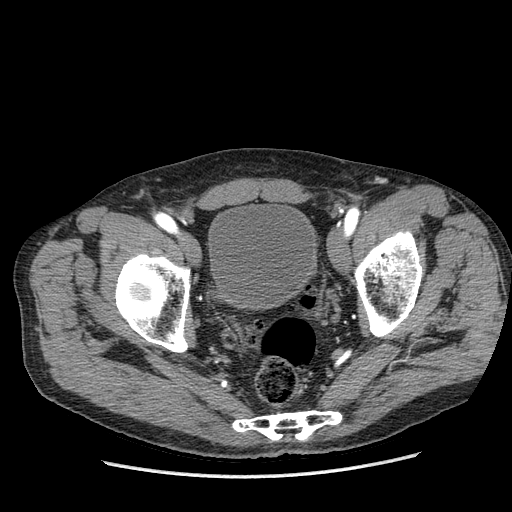
[im 67/244  soft-tissue]
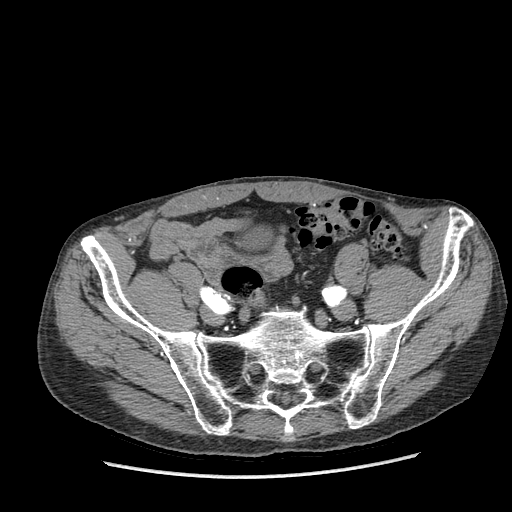
[im 89/244  soft-tissue]
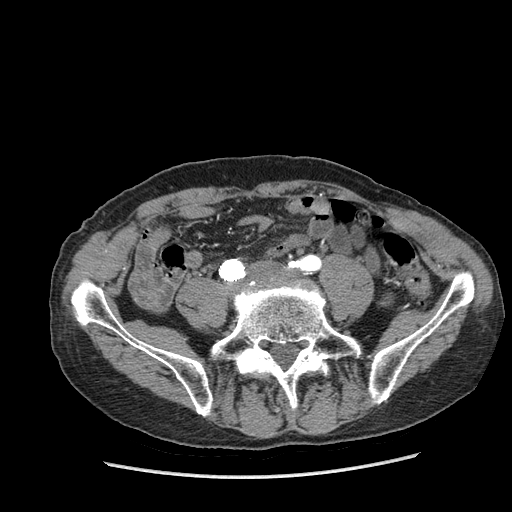
[im 111/244  soft-tissue]
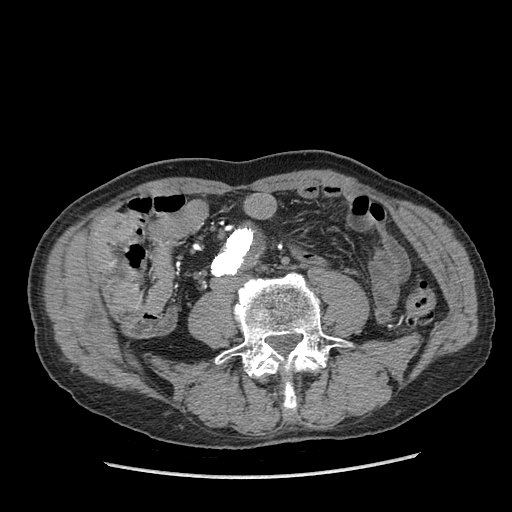
[im 133/244  soft-tissue]
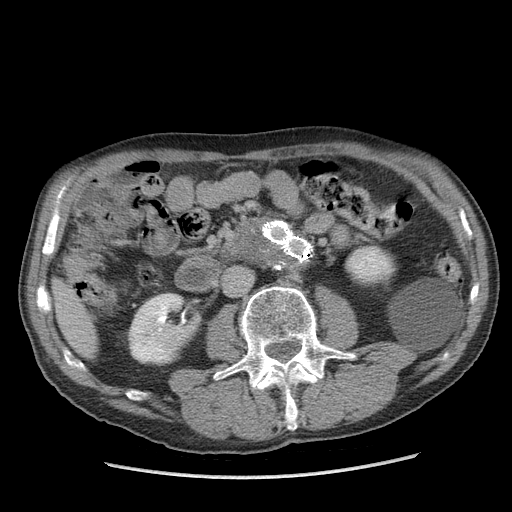
[im 155/244  soft-tissue]
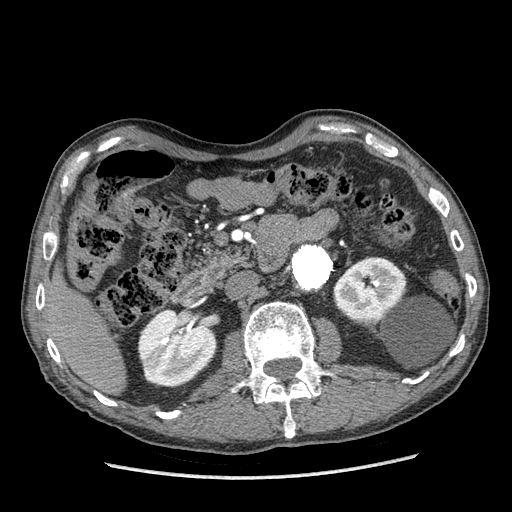
[im 155/244  lung]
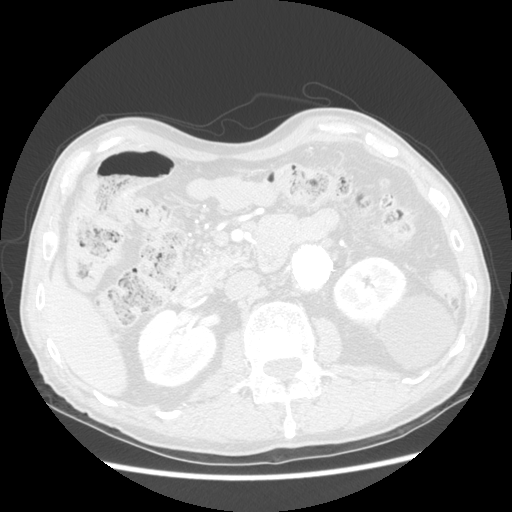
[im 177/244  soft-tissue]
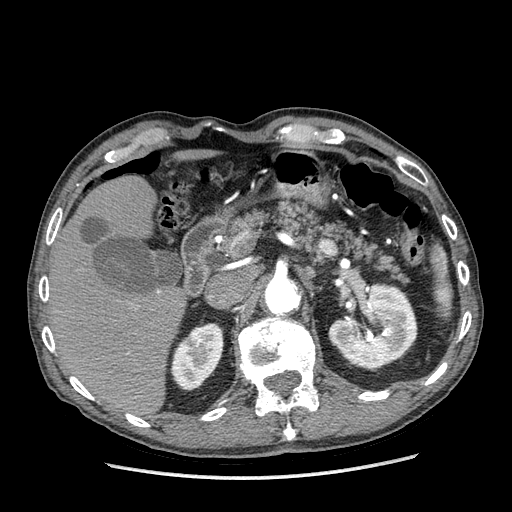
[im 177/244  lung]
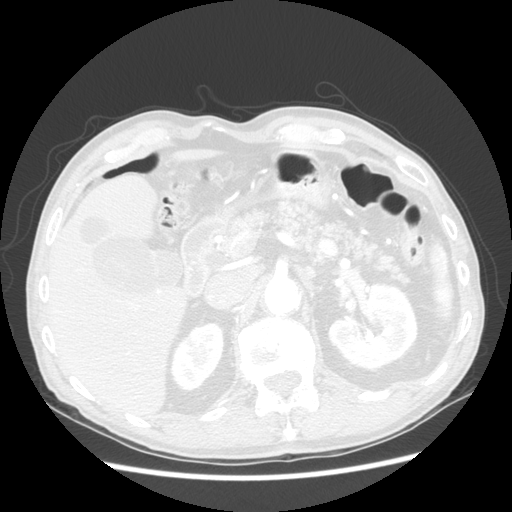
[im 199/244  soft-tissue]
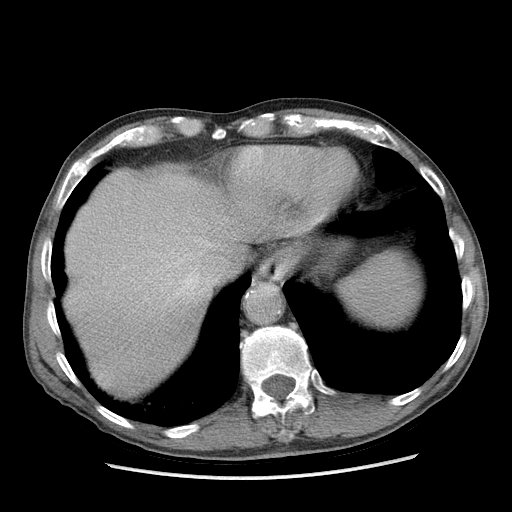
[im 199/244  lung]
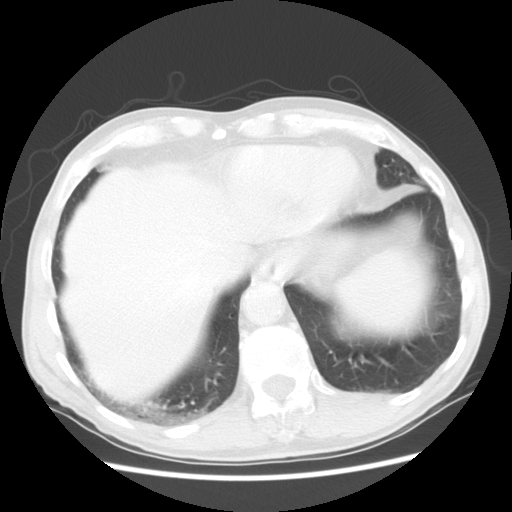
[im 199/244  bone]
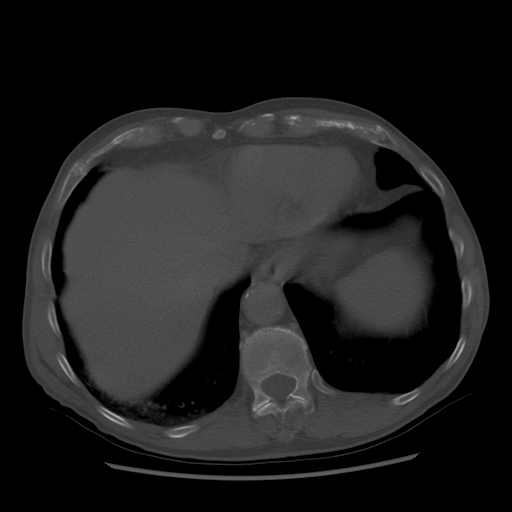
[im 221/244  soft-tissue]
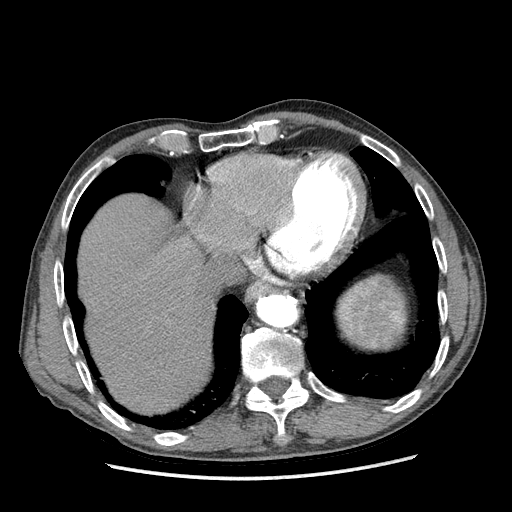
[im 221/244  lung]
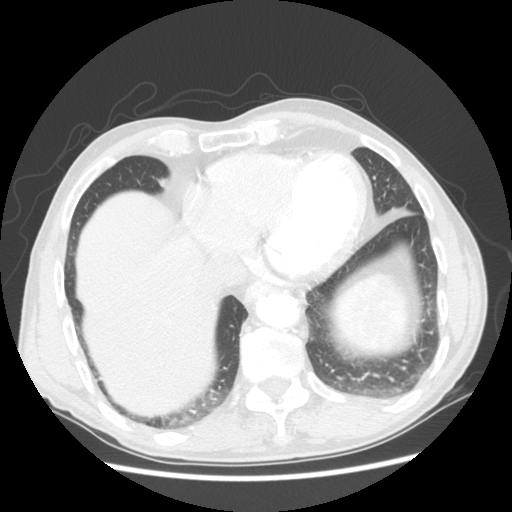

[Series 602: sagittal body · sagittal · 0.95mm/px · 1 of 149 slices shown]
[im 25/149  soft-tissue]
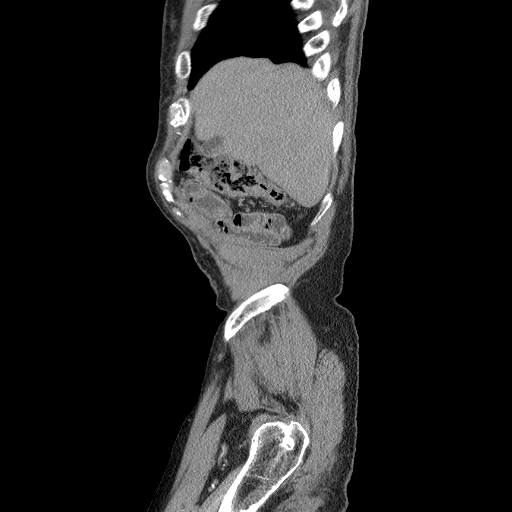

[11 of 46 positions shown; findings below may reference images not displayed]

FINDINGS: Limited images through the lower thorax demonstrate mild
bibasilar atelectasis.  Otherwise clear lung bases.  Partially
imaged heart demonstrates coronary artery calcifications and/or
stent.  Mild aortic valve calcification and scattered
atherosclerotic calcification of the descending thoracic aorta.

Unremarkable liver, spleen, pancreas.  Bilateral adrenal gland
nodularity without a dominant nodule.

Cholelithiasis.  Polypoid enhancing lesions within the gallbladder,
the largest measures up to 2.7 x 2.2 cm (series 5, image 64). No
intra or extrahepatic biliary ductal dilatation.

5.3 cm lower pole left renal cyst.  Additional subcentimeter
hypodensity within the upper pole right kidney is unchanged and
likely a cyst.  Mild lobular contour bilaterally suggest small
areas of scarring within the cortex.

Large bowel diverticulosis without CT evidence for diverticulitis.
No bowel obstruction, with small bowel loops of normal caliber.  No
free intraperitoneal fluid or air.  No significant mesenteric or
retroperitoneal lymphadenopathy.

Prostatomegaly measuring 5.8 cm transverse diameter.  Thin-walled
bladder with a small diverticulum arising along the right
posterolateral wall.

Infrarenal aortobi-iliac endograft in place. The aorta is tortuous
with the aneurysmal sac measuring up to 5.0 cm (image 91 series 5).
This is without significant interval change. The previously noted
type 2 endoleak is likely still present (image 77 for example)
however significantly less apparent on today's examination.  The
celiac artery origin remains patent.  There is mild eccentric
thrombus within the proximal SMA however the origin remains widely
patent.  The IMA is occluded at its origin and reconstitutes
slightly distal to this point.  Accessory right renal artery
supplies the lower pole.  Bilateral common iliac arteries, internal
and external iliac arteries are patent with areas of
atherosclerosis.

Interval increased compression deformity of the superior plate of
L1 and L2, with mild (approximately 25% height loss at L1 and less
than 25% height loss at L2. Vacuum disc phenomenon at T12-L1.
There is grade 1 anterolisthesis of L4 on L5. No aggressive osseous
lesion identified.
IMPRESSION: Status post aortobi-iliac endograft. The previously described type
2 endoleak persists though less apparent today.  Stable aneurysmal
sac diameter at 5 cm.

2.7 x 2.2 cm polypoid enhancing lesion within the gallbladder.
Surgical consultation recommended.

Discussed via telephone with Dr. Gerilda at [DATE] p.m. and Dr. Herschel

Interval (though likely remote) superior endplate compression
deformity of L1 and L2.  Correlate with clinical symptoms/point
tenderness.

## 2013-06-18 ENCOUNTER — Telehealth: Payer: Self-pay | Admitting: Vascular Surgery

## 2013-06-18 NOTE — Telephone Encounter (Signed)
Kendal HymenBonnie called to let us know her husband Moise BoringDonnie, passed away on March 16, 2013. He has appointment coming up that we need to cancel on Feb 12th 2015.

## 2013-06-19 NOTE — Telephone Encounter (Signed)
CONFIRMED DATE OF DEATH October 20,2014 PER OBITUARY.

## 2013-07-07 ENCOUNTER — Ambulatory Visit: Payer: Medicare Other | Admitting: Neurosurgery

## 2013-07-07 ENCOUNTER — Other Ambulatory Visit: Payer: Medicare Other

## 2013-07-09 ENCOUNTER — Ambulatory Visit: Payer: Medicare Other | Admitting: Family

## 2013-07-09 ENCOUNTER — Other Ambulatory Visit (HOSPITAL_COMMUNITY): Payer: Medicare Other

## 2013-12-28 NOTE — Telephone Encounter (Signed)
Patients status changed to deceased in [facility]. Jennifer L Arrington ° °
